# Patient Record
Sex: Female | Born: 1985 | Race: White | Hispanic: No | Marital: Single | Smoking: Current every day smoker
Health system: Southern US, Community
[De-identification: ages and names within clinical notes are randomized; demographics above are authoritative.]

## PROBLEM LIST (undated history)

## (undated) HISTORY — PX: OTHER SURGICAL HISTORY: SHX169

## (undated) HISTORY — PX: COSMETIC SURGERY: SHX468

---

## 2013-12-30 ENCOUNTER — Ambulatory Visit (INDEPENDENT_AMBULATORY_CARE_PROVIDER_SITE_OTHER): Payer: Managed Care, Other (non HMO) | Admitting: Obstetrics & Gynecology

## 2013-12-30 ENCOUNTER — Encounter: Payer: Self-pay | Admitting: Obstetrics & Gynecology

## 2013-12-30 VITALS — BP 147/86 | HR 107 | Resp 16 | Ht 59.0 in | Wt 123.0 lb

## 2013-12-30 DIAGNOSIS — Z30433 Encounter for removal and reinsertion of intrauterine contraceptive device: Secondary | ICD-10-CM

## 2013-12-30 DIAGNOSIS — Z30017 Encounter for initial prescription of implantable subdermal contraceptive: Secondary | ICD-10-CM

## 2013-12-30 DIAGNOSIS — Z308 Encounter for other contraceptive management: Secondary | ICD-10-CM

## 2013-12-30 MED ORDER — ETONOGESTREL 68 MG ~~LOC~~ IMPL: 1.0000 | DRUG_IMPLANT | Freq: Once | SUBCUTANEOUS | Status: DC

## 2013-12-30 MED ORDER — ETONOGESTREL 68 MG ~~LOC~~ IMPL
68.0000 mg | DRUG_IMPLANT | Freq: Once | SUBCUTANEOUS | Status: AC
  Administered 2013-12-30: 68 mg via SUBCUTANEOUS

## 2013-12-30 NOTE — Progress Notes (Signed)
   Subjective:    Patient ID: Cheryl CadetMaribeth Kennedy, female    DOB: [birth year], 28 y.o.   MRN: [id number]  HPI  28 yo G0 is here to have her soon to expire Nexplanon replaced with a new one. She has occasional spotting.  Review of Systems     Objective:   Physical Exam Consent was signed and time out was done. Her right arm was prepped with betadine after establishing the position of the Nexplanon. The area was infiltrated with 2 cc of 1% lidocaine. A small incision was made and the intact rod was easily removed. A steristrip was placed and her arm was noted to be hemostatic. It was bandaged.  She tolerated the procedure well.  After adequate anesthesia was assured, the Nexplanon device was placed according to standard of care. Her arm was hemostatic and was bandaged. She tolerated the procedure well.       Assessment & Plan:  Contraception- Nexplanon Preventative care- RTC 1 month for annual exam

## 2014-01-16 ENCOUNTER — Ambulatory Visit (INDEPENDENT_AMBULATORY_CARE_PROVIDER_SITE_OTHER): Payer: Managed Care, Other (non HMO) | Admitting: Obstetrics & Gynecology

## 2014-01-16 ENCOUNTER — Encounter: Payer: Self-pay | Admitting: Obstetrics & Gynecology

## 2014-01-16 VITALS — BP 122/75 | HR 78 | Resp 16 | Ht 59.0 in | Wt 126.0 lb

## 2014-01-16 DIAGNOSIS — Z113 Encounter for screening for infections with a predominantly sexual mode of transmission: Secondary | ICD-10-CM

## 2014-01-16 DIAGNOSIS — Z124 Encounter for screening for malignant neoplasm of cervix: Secondary | ICD-10-CM

## 2014-01-16 DIAGNOSIS — Z0000 Encounter for general adult medical examination without abnormal findings: Secondary | ICD-10-CM

## 2014-01-16 DIAGNOSIS — R1900 Intra-abdominal and pelvic swelling, mass and lump, unspecified site: Secondary | ICD-10-CM

## 2014-01-16 NOTE — Progress Notes (Signed)
Subjective:    Cheryl CadetMaribeth Kennedy is a 28 y.o. SW G0 female who presents for an annual exam. The patient has no complaints today. She is happy with her new Nexplanon. The patient is sexually active. GYN screening history: last pap: was normal. The patient wears seatbelts: yes. The patient participates in regular exercise: yes. Has the patient ever been transfused or tattooed?: yes. The patient reports that there is not domestic violence in her life.   Menstrual History: OB History    Gravida Para Term Preterm AB TAB SAB Ectopic Multiple Living   0 0 0 0 0 0 0 0 0 0       Menarche age: [age]  No LMP recorded. Patient has had an implant.    The following portions of the patient's history were reviewed and updated as appropriate: allergies, current medications, past family history, past medical history, past social history, past surgical history and problem list.  Review of Systems A comprehensive review of systems was negative. She reports some positional dyspareunia for "forever". Monogamous for about 18 months, lives by herself. 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 Declines a flu vaccine.   Objective:    BP 122/75 mmHg  Pulse 78  Resp 16  Ht 4\' 11"  (1.499 m)  Wt 126 lb (57.153 kg)  BMI 25.44 kg/m2  General Appearance:    Alert, cooperative, no distress, appears stated age  Head:    Normocephalic, without obvious abnormality, atraumatic  Eyes:    PERRL, conjunctiva/corneas clear, EOM's intact, fundi    benign, both eyes  Ears:    Normal TM's and external ear canals, both ears  Nose:   Nares normal, septum midline, mucosa normal, no drainage    or sinus tenderness  Throat:   Lips, mucosa, and tongue normal; teeth and gums normal  Neck:   Supple, symmetrical, trachea midline, no adenopathy;    thyroid:  no enlargement/tenderness/nodules; no carotid   bruit or JVD  Back:     Symmetric, no curvature, ROM normal, no CVA tenderness  Lungs:     Clear to  auscultation bilaterally, respirations unlabored  Chest Wall:    No tenderness or deformity   Heart:    Regular rate and rhythm, S1 and S2 normal, no murmur, rub   or gallop  Breast Exam:    No tenderness, masses, or nipple abnormality  Abdomen:     Soft, non-tender, bowel sounds active all four quadrants,    no masses, no organomegaly  Genitalia:    Normal female without lesion, discharge or tenderness, shaved, NSSA, NT, mobile, very hard 2 cm area in RLQ, tender     Extremities:   Extremities normal, atraumatic, no cyanosis or edema  Pulses:   2+ and symmetric all extremities  Skin:   Skin color, texture, turgor normal, no rashes or lesions  Lymph nodes:   Cervical, supraclavicular, and axillary nodes normal  Neurologic:   CNII-XII intact, normal strength, sensation and reflexes    throughout  .    Assessment:    Healthy female exam.   RLQ mass   Plan:     Breast self exam technique reviewed and patient encouraged to perform self-exam monthly. Chlamydia specimen. GC specimen. Thin prep Pap smear.   STI testing Gyn u/s

## 2014-01-17 LAB — HEPATITIS C ANTIBODY: HCV Ab: NEGATIVE

## 2014-01-17 LAB — HIV ANTIBODY (ROUTINE TESTING W REFLEX): HIV 1&2 Ab, 4th Generation: NONREACTIVE

## 2014-01-17 LAB — RPR

## 2014-01-17 LAB — TSH: TSH: 0.689 u[IU]/mL (ref 0.350–4.500)

## 2014-01-17 LAB — HEPATITIS B SURFACE ANTIGEN: Hepatitis B Surface Ag: NEGATIVE

## 2014-01-20 ENCOUNTER — Ambulatory Visit (INDEPENDENT_AMBULATORY_CARE_PROVIDER_SITE_OTHER): Payer: Managed Care, Other (non HMO)

## 2014-01-20 DIAGNOSIS — R1900 Intra-abdominal and pelvic swelling, mass and lump, unspecified site: Secondary | ICD-10-CM

## 2014-01-20 DIAGNOSIS — R1909 Other intra-abdominal and pelvic swelling, mass and lump: Secondary | ICD-10-CM

## 2014-01-20 LAB — CYTOLOGY - PAP

## 2014-01-28 ENCOUNTER — Encounter: Payer: Self-pay | Admitting: Obstetrics & Gynecology

## 2014-01-28 ENCOUNTER — Ambulatory Visit: Payer: Managed Care, Other (non HMO) | Admitting: Obstetrics & Gynecology

## 2014-01-28 VITALS — BP 121/81 | HR 87 | Resp 16 | Ht 59.0 in | Wt 126.0 lb

## 2014-01-28 DIAGNOSIS — R1900 Intra-abdominal and pelvic swelling, mass and lump, unspecified site: Secondary | ICD-10-CM

## 2014-01-28 NOTE — Progress Notes (Signed)
   Subjective:    Patient ID: Cheryl Kennedy, female    DOB: [birth year], 28 y.o.   MRN: [id number]  HPI She is here for her u/s results. I had felt a hard mass on my bimanual exam.   Review of Systems     Objective:   Physical Exam Normal u/s       Assessment & Plan:  Pelvic mass on u/s - must have been stool Reassurance given RTC 1 year/prn sooner

## 2015-06-09 IMAGING — US US TRANSVAGINAL NON-OB
1 series · 14 of 25 positions shown · non-contrast
Comparison: None

CLINICAL DATA: Palpable pelvic mass during annual exam. History of
implant in arm for birth control. Intermittent spotting. No known
LMP.

EXAM:
TRANSABDOMINAL AND TRANSVAGINAL ULTRASOUND OF PELVIS
TECHNIQUE: Both transabdominal and transvaginal ultrasound examinations of the
pelvis were performed. Transabdominal technique was performed for
global imaging of the pelvis including uterus, ovaries, adnexal
regions, and pelvic cul-de-sac. It was necessary to proceed with
endovaginal exam following the transabdominal exam to visualize the
uterus and endometrium, ovaries, adnexal region.

[Series 1: us transvaginal non-ob · 0.14mm/px · 14 of 100 slices shown]
[im 1/100]
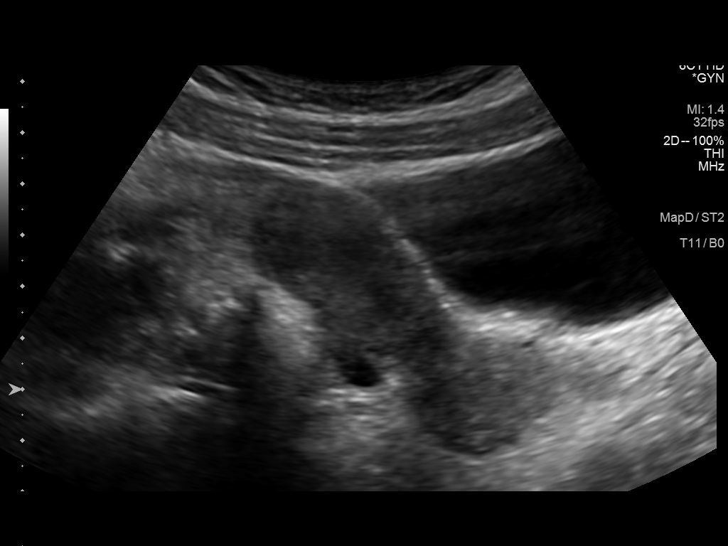
[im 9/100]
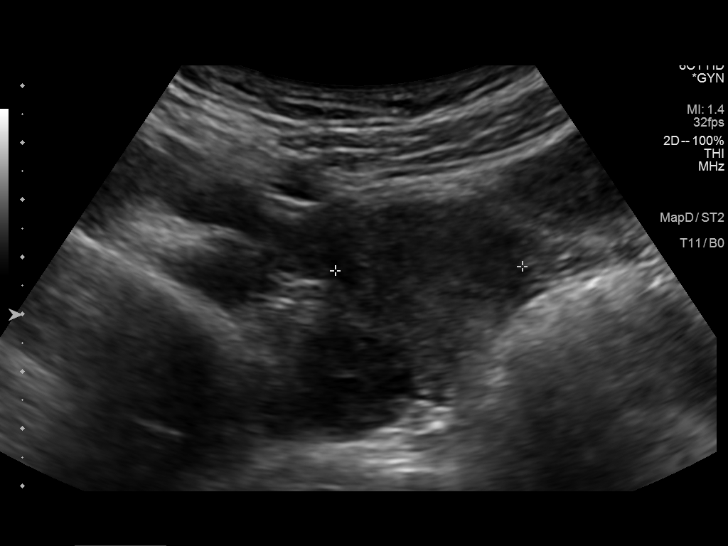
[im 17/100]
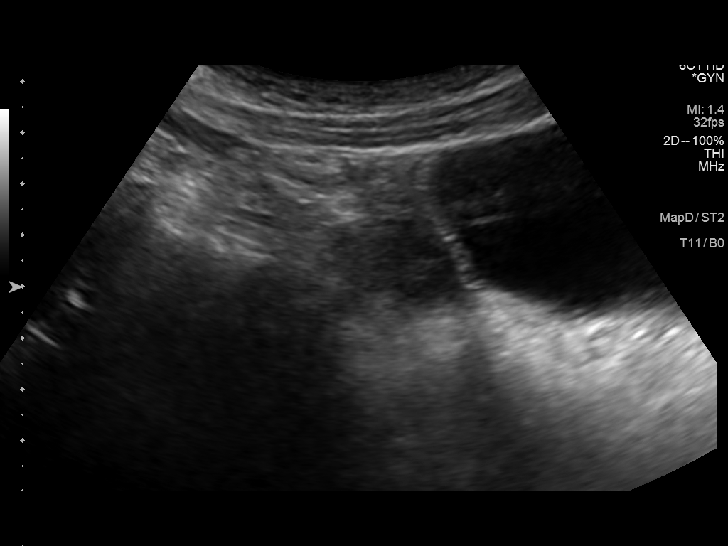
[im 25/100]
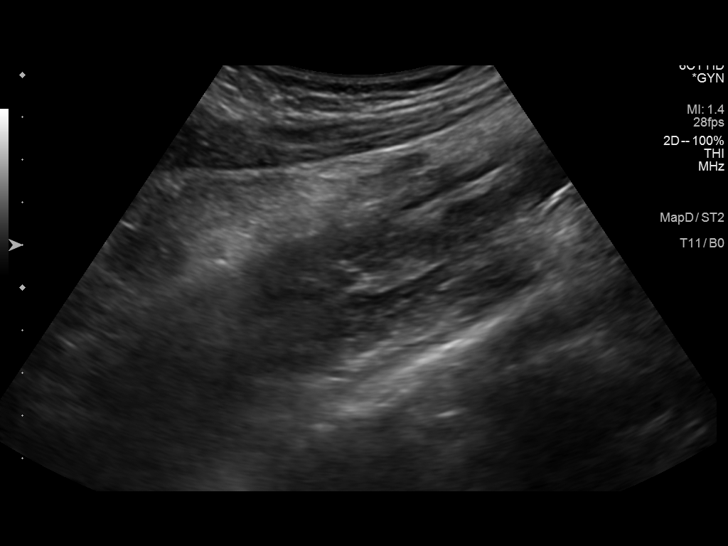
[im 34/100]
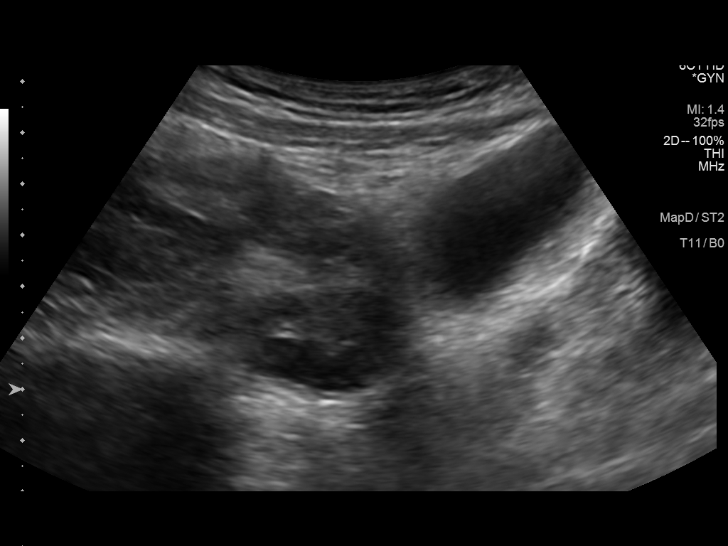
[im 38/100]
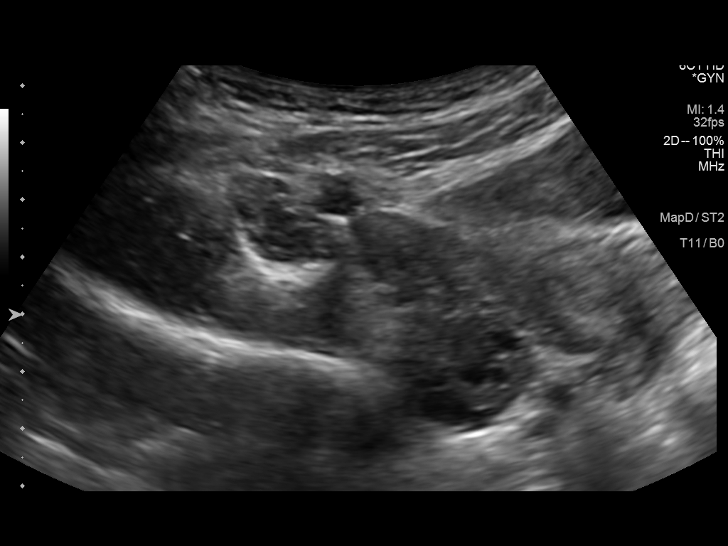
[im 46/100]
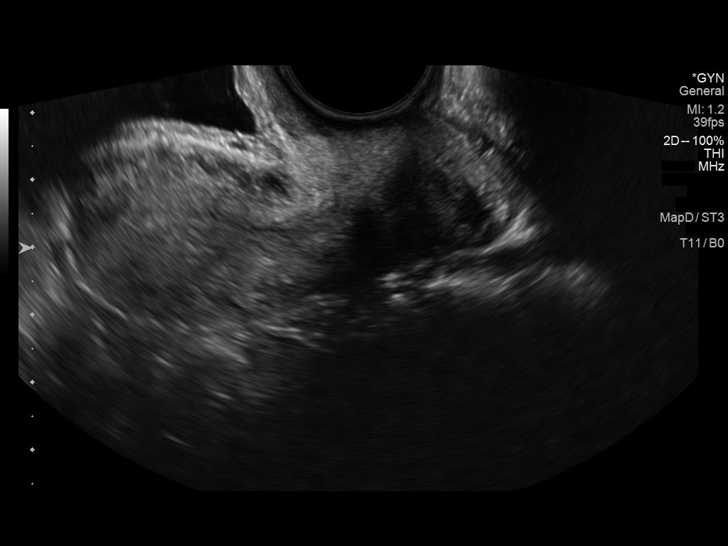
[im 54/100]
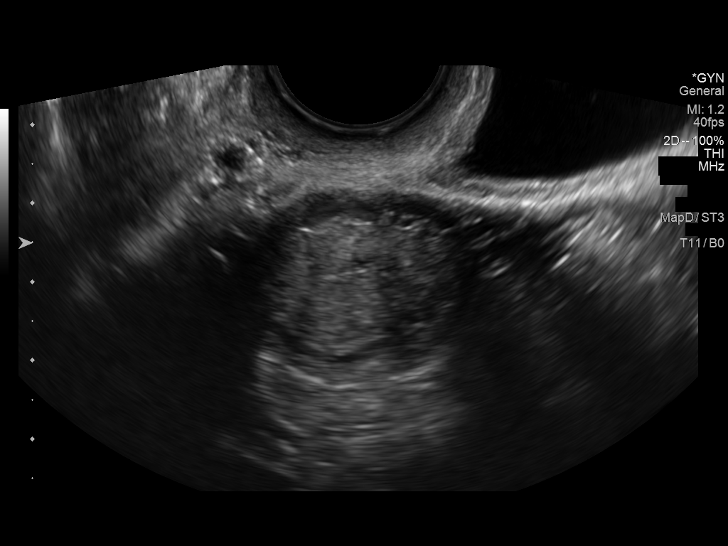
[im 62/100]
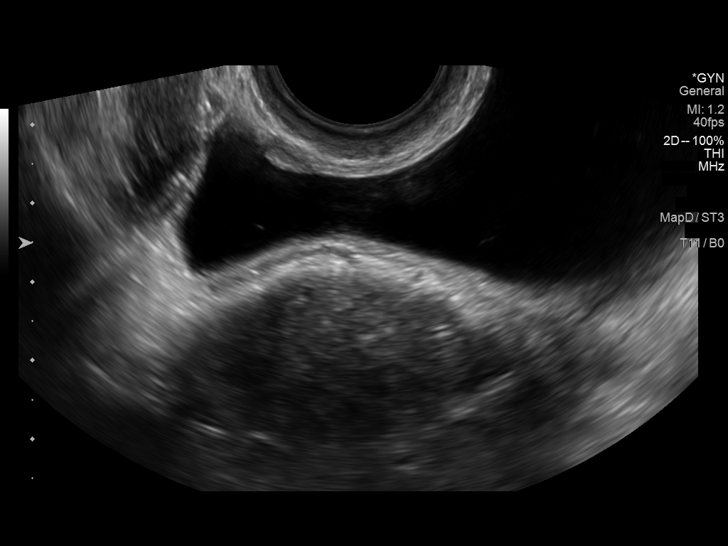
[im 67/100]
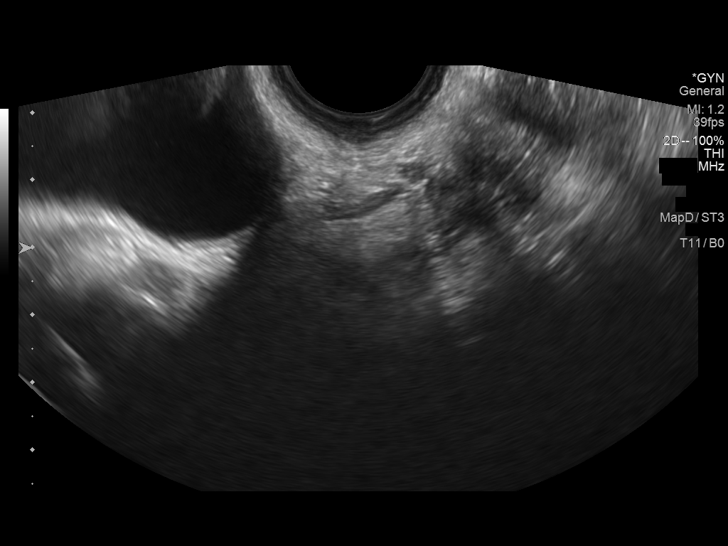
[im 75/100]
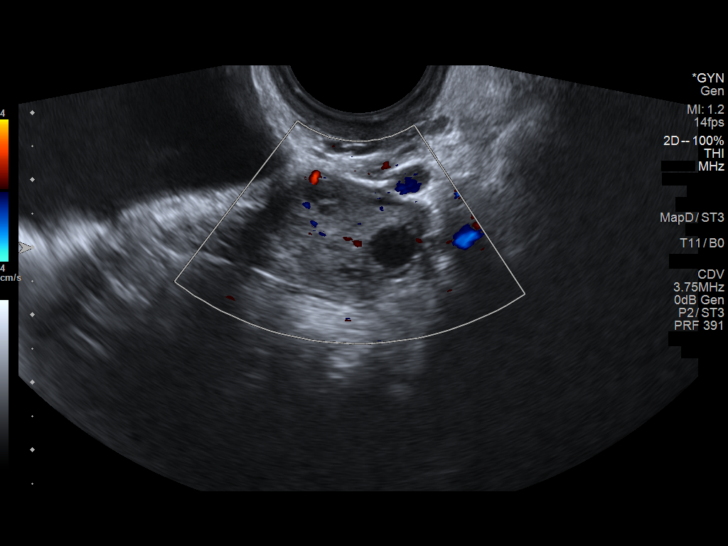
[im 83/100]
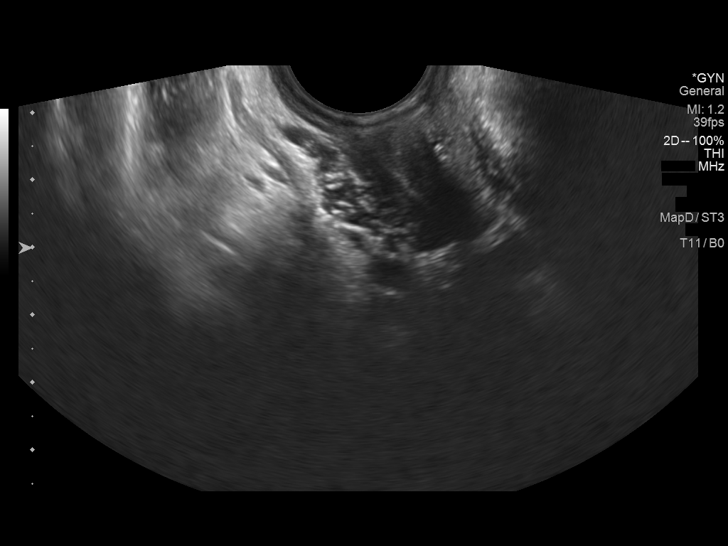
[im 91/100]
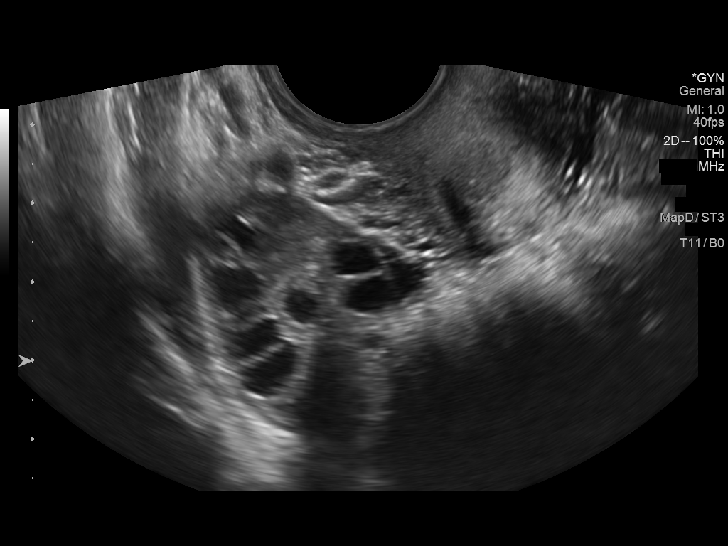
[im 100/100]
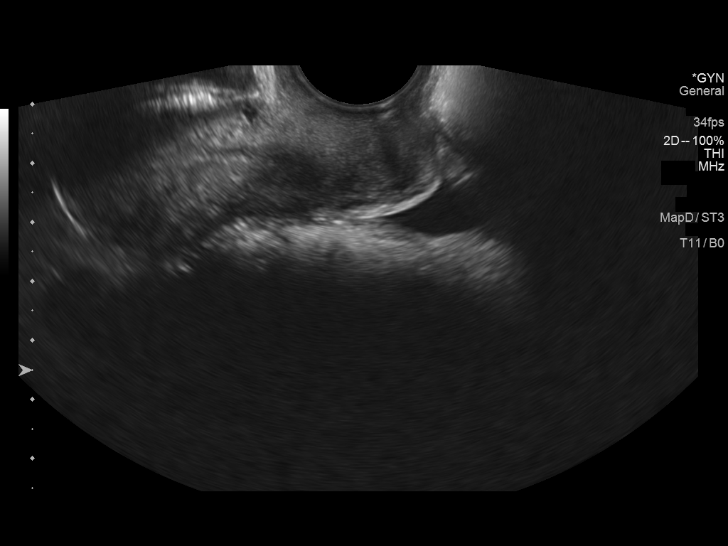

[14 of 25 positions shown; findings below may reference images not displayed]

FINDINGS: Uterus

Measurements: 5.7 x 2.8 x 3.4 cm. No fibroids or other mass
visualized.

Endometrium

Thickness: 2.0 mm.  No focal abnormality visualized.

Right ovary

Measurements: 3.4 x 2.4 x 1.9 cm. Normal appearance/no adnexal mass.

Left ovary

Measurements: 2.9 x 2.1 x 3.0 cm.. Normal appearance/no adnexal
mass.

Other findings

Small amount of free pelvic fluid is noted.
IMPRESSION: Normal pelvic ultrasound.  No adnexal mass identified.

## 2017-01-24 ENCOUNTER — Ambulatory Visit (INDEPENDENT_AMBULATORY_CARE_PROVIDER_SITE_OTHER): Payer: Managed Care, Other (non HMO) | Admitting: Obstetrics and Gynecology

## 2017-01-24 ENCOUNTER — Encounter: Payer: Self-pay | Admitting: Obstetrics and Gynecology

## 2017-01-24 VITALS — BP 124/73 | HR 85 | Resp 16 | Ht 59.0 in | Wt 130.0 lb

## 2017-01-24 DIAGNOSIS — Z3046 Encounter for surveillance of implantable subdermal contraceptive: Secondary | ICD-10-CM

## 2017-01-24 DIAGNOSIS — Z01419 Encounter for gynecological examination (general) (routine) without abnormal findings: Secondary | ICD-10-CM

## 2017-01-24 DIAGNOSIS — Z124 Encounter for screening for malignant neoplasm of cervix: Secondary | ICD-10-CM | POA: Diagnosis not present

## 2017-01-24 MED ORDER — NORGESTIMATE-ETH ESTRADIOL 0.25-35 MG-MCG PO TABS: 1.0000 | ORAL_TABLET | Freq: Every day | ORAL | 11 refills | Status: DC

## 2017-01-24 MED ORDER — ETONOGESTREL 68 MG ~~LOC~~ IMPL: 1.0000 | DRUG_IMPLANT | Freq: Once | SUBCUTANEOUS | 0 refills | Status: DC

## 2017-01-24 NOTE — Progress Notes (Signed)
Subjective:     Cheryl Kennedy is a 31 y.o. female G0 with BMI 26 who and is here for a comprehensive physical exam. The patient reports no problems. Patient is sexually active using Nexplanon for contraception. She reports amenorrhea with Nexplanon. She is due to have it removed and desires to have it replaced. Patient is without any other complaints. She denies pelvic pain or abnormal discharge  History reviewed. No pertinent past medical history. Past Surgical History:  Procedure Laterality Date  . COSMETIC SURGERY     breast enhancement   Family History  Problem Relation Age of Onset  . Hypertension Mother   . Diabetes Maternal Grandfather   . Stroke Maternal Grandfather   . Cancer Maternal Grandfather   . Cancer Paternal Grandfather      Social History   Socioeconomic History  . Marital status: Single    Spouse name: Not on file  . Number of children: Not on file  . Years of education: Not on file  . Highest education level: Not on file  Social Needs  . Financial resource strain: Not on file  . Food insecurity - worry: Not on file  . Food insecurity - inability: Not on file  . Transportation needs - medical: Not on file  . Transportation needs - non-medical: Not on file  Occupational History  . Not on file  Tobacco Use  . Smoking status: Current Every Day Smoker    Packs/day: 0.25    Years: 7.00    Pack years: 1.75    Types: Cigarettes  . Smokeless tobacco: Never Used  Substance and Sexual Activity  . Alcohol use: No    Alcohol/week: 0.0 oz  . Drug use: No  . Sexual activity: Yes    Partners: Male    Birth control/protection: Implant  Other Topics Concern  . Not on file  Social History Narrative  . Not on file   Health Maintenance  Topic Date Due  . TETANUS/TDAP  02/14/2007  . INFLUENZA VACCINE  09/14/2016  . PAP SMEAR  01/16/2017  . HIV Screening  Completed       Review of Systems Pertinent items are noted in HPI.   Objective:  Blood pressure  124/73, pulse 85, resp. rate 16, height 4\' 11"  (1.499 m), weight 130 lb (59 kg).     GENERAL: Well-developed, well-nourished female in no acute distress.  HEENT: Normocephalic, atraumatic. Sclerae anicteric.  NECK: Supple. Normal thyroid.  LUNGS: Clear to auscultation bilaterally.  HEART: Regular rate and rhythm. BREASTS: Symmetric in size. No palpable masses or lymphadenopathy, skin changes, or nipple drainage. ABDOMEN: Soft, nontender, nondistended. No organomegaly. PELVIC: Normal external female genitalia. Vagina is pink and rugated.  Normal discharge. Normal appearing cervix. Uterus is normal in size. No adnexal mass or tenderness. EXTREMITIES: No cyanosis, clubbing, or edema, 2+ distal pulses.    Assessment:    Healthy female exam.      Plan:    pap smear collected  Nexplanon Removal Patient given informed consent for removal of her Implanon, time out was performed.  Signed copy in the chart.  Appropriate time out taken. Implanon site identified.  Area prepped in usual sterile fashon. One cc of 1% lidocaine was used to anesthetize the area at the distal end of the implant. A small stab incision was made right beside the implant on the distal portion.  A pressure bandage was applied to reduce any bruising.  The patient tolerated the procedure well and was given post procedure  instructions.  Nexplanon removal attempted. It was difficult to palpate. Nexplanon was seen on ultrasound but was deep in the tissue. It was grasped twice but was difficult to bring to the skin incision. Nexplanon procedure was aborted. Patient will be schedule for removal in the OR.  Discussed contraception options until Nexplanon removal and replacement. Patient opted for OCP. Rx provided Patient will be contacted with abnormal results  See After Visit Summary for Counseling Recommendations

## 2017-01-25 ENCOUNTER — Encounter (HOSPITAL_COMMUNITY): Payer: Self-pay

## 2017-01-26 LAB — CYTOLOGY - PAP: Diagnosis: NEGATIVE

## 2017-02-01 ENCOUNTER — Telehealth (HOSPITAL_COMMUNITY): Payer: Self-pay

## 2017-02-01 NOTE — Telephone Encounter (Signed)
Sent the patient a letter with her surgery date and time on 01/25/2017. Letter returned on 01/1917, with a returned mail sticker that states "temporarily away".  Called patient to give her surgery date and time, no answer, unable to leave voice message, mailbox full.  Patient does not have an active mychart account.

## 2017-02-23 ENCOUNTER — Encounter (HOSPITAL_BASED_OUTPATIENT_CLINIC_OR_DEPARTMENT_OTHER): Payer: Self-pay | Admitting: *Deleted

## 2017-02-23 ENCOUNTER — Other Ambulatory Visit: Payer: Self-pay

## 2017-02-24 ENCOUNTER — Encounter (HOSPITAL_BASED_OUTPATIENT_CLINIC_OR_DEPARTMENT_OTHER)
Admission: RE | Admit: 2017-02-24 | Discharge: 2017-02-24 | Disposition: A | Discharge status: Home / Self Care | Payer: Managed Care, Other (non HMO) | Source: Ambulatory Visit | Attending: Obstetrics and Gynecology | Admitting: Obstetrics and Gynecology

## 2017-02-24 DIAGNOSIS — Z01812 Encounter for preprocedural laboratory examination: Secondary | ICD-10-CM | POA: Insufficient documentation

## 2017-02-24 LAB — CBC
HEMATOCRIT: 42.2 % (ref 36.0–46.0)
Hemoglobin: 13.9 g/dL (ref 12.0–15.0)
MCH: 30.7 pg (ref 26.0–34.0)
MCHC: 32.9 g/dL (ref 30.0–36.0)
MCV: 93.2 fL (ref 78.0–100.0)
PLATELETS: 273 10*3/uL (ref 150–400)
RBC: 4.53 MIL/uL (ref 3.87–5.11)
RDW: 12.8 % (ref 11.5–15.5)
WBC: 8 10*3/uL (ref 4.0–10.5)

## 2017-02-24 LAB — POCT PREGNANCY, URINE: Preg Test, Ur: NEGATIVE

## 2017-02-28 ENCOUNTER — Telehealth: Payer: Self-pay

## 2017-02-28 NOTE — H&P (Signed)
Cheryl CadetMaribeth Kennedy is an 32 y.o. female here for Nexplanon removal due to expired device. Patient has been using OCP for contraception. Patient is without complaints. Nexplanon removal was attempted in the office but was found difficult to remove. Nexplanon is placed in her left arm  Pertinent Gynecological History: Menses: regular every month without intermenstrual spotting Contraception: OCP (estrogen/progesterone) DES exposure: denies Blood transfusions: none Sexually transmitted diseases: no past history Previous GYN Procedures: n/a  Last mammogram: n/a Last pap: normal Date: 01/2017   Menstrual History: [name] was 02/17/2017.    History reviewed. No pertinent past medical history.  Past Surgical History:  Procedure Laterality Date  . breast augmentation    . COSMETIC SURGERY     breast enhancement    Family History  Problem Relation Age of Onset  . Hypertension Mother   . Diabetes Maternal Grandfather   . Stroke Maternal Grandfather   . Cancer Maternal Grandfather   . Cancer Paternal Grandfather     Social History:  reports that she has been smoking cigarettes.  She has a 1.75 pack-year smoking history. she has never used smokeless tobacco. She reports that she does not drink alcohol or use drugs.  Allergies: No Known Allergies  Medications Prior to Admission  Medication Sig Dispense Refill Last Dose  . norgestimate-ethinyl estradiol (ORTHO-CYCLEN,SPRINTEC,PREVIFEM) 0.25-35 MG-MCG tablet Take 1 tablet by mouth daily. 1 Package 11 03/01/2017 at 0800  . etonogestrel (NEXPLANON) 68 MG IMPL implant 1 each by Subdermal route once.   Taking  . etonogestrel (NEXPLANON) 68 MG IMPL implant 1 each (68 mg total) by Subdermal route once for 1 dose. 1 each 0     ROS See pertinent in HPI Blood pressure 130/77, pulse 98, temperature 98 F (36.7 C), temperature source Oral, resp. rate 20, height 4\' 11"  (1.499 m), weight 125 lb (56.7 kg), last menstrual period  02/17/2017, SpO2 98 %. Physical Exam GENERAL: Well-developed, well-nourished female in no acute distress.  HEENT: Normocephalic, atraumatic. Sclerae anicteric.  NECK: Supple. Normal thyroid.  LUNGS: Clear to auscultation bilaterally.  HEART: Regular rate and rhythm. ABDOMEN: Soft, nontender, nondistended. No organomegaly. PELVIC: Not indicated EXTREMITIES: No cyanosis, clubbing, or edema, 2+ distal pulses.  No results found for this or any previous visit (from the past 24 hour(s)).  No results found.  Assessment/Plan: 32 yo here for Nexplanon removal due to expired device.  - Risks, benefits and alternatives were explained including but not limited to risks of bleeding, infection and damage to adjacent structures (muscle and neuro/vascular system). Patient verbalized understanding and all questions were answered - Patient plans to continue with OCP for contraception at this time  Cheryl Kennedy 03/01/2017, 1:24 PM

## 2017-02-28 NOTE — Telephone Encounter (Signed)
Patient returned call back stating that for her surgery tomorrow she just wants the nexplanon removed and not replaced. She does not want nexplanon inserted again. I made patient aware that her device is being shipped to [place] office tomorrow and I will call patient when it arrives so that she can call her insurance company back to see how to return device since she does not want to use it any more.

## 2017-02-28 NOTE — Telephone Encounter (Signed)
Called patient left message to call office. [facility] about her delivery of Nexplanon device for surgery insert tomorrow.

## 2017-03-01 ENCOUNTER — Ambulatory Visit (HOSPITAL_BASED_OUTPATIENT_CLINIC_OR_DEPARTMENT_OTHER): Payer: 59 | Admitting: Certified Registered"

## 2017-03-01 ENCOUNTER — Encounter (HOSPITAL_BASED_OUTPATIENT_CLINIC_OR_DEPARTMENT_OTHER)
Admission: RE | Disposition: A | Discharge status: Home / Self Care | Payer: Self-pay | Source: Ambulatory Visit | Attending: Obstetrics and Gynecology

## 2017-03-01 ENCOUNTER — Ambulatory Visit (HOSPITAL_BASED_OUTPATIENT_CLINIC_OR_DEPARTMENT_OTHER)
Admission: RE | Admit: 2017-03-01 | Discharge: 2017-03-01 | Disposition: A | Discharge status: Home / Self Care | Payer: 59 | Source: Ambulatory Visit | Attending: Obstetrics and Gynecology | Admitting: Obstetrics and Gynecology

## 2017-03-01 ENCOUNTER — Encounter (HOSPITAL_BASED_OUTPATIENT_CLINIC_OR_DEPARTMENT_OTHER): Payer: Self-pay | Admitting: *Deleted

## 2017-03-01 ENCOUNTER — Telehealth: Payer: Self-pay

## 2017-03-01 ENCOUNTER — Other Ambulatory Visit: Payer: Self-pay

## 2017-03-01 DIAGNOSIS — Z3046 Encounter for surveillance of implantable subdermal contraceptive: Secondary | ICD-10-CM | POA: Diagnosis not present

## 2017-03-01 DIAGNOSIS — F17210 Nicotine dependence, cigarettes, uncomplicated: Secondary | ICD-10-CM | POA: Diagnosis not present

## 2017-03-01 HISTORY — PX: REMOVAL OF DRUG DELIVERY IMPLANT: SHX6585

## 2017-03-01 SURGERY — REMOVAL, DRUG DELIVERY IMPLANT
Anesthesia: General | Site: Arm Upper | Laterality: Left

## 2017-03-01 MED ORDER — MIDAZOLAM HCL 2 MG/2ML IJ SOLN
INTRAMUSCULAR | Status: AC
  Filled 2017-03-01: qty 2

## 2017-03-01 MED ORDER — DEXAMETHASONE SODIUM PHOSPHATE 10 MG/ML IJ SOLN
INTRAMUSCULAR | Status: DC | PRN
  Administered 2017-03-01: 10 mg via INTRAVENOUS

## 2017-03-01 MED ORDER — HYDROMORPHONE HCL 1 MG/ML IJ SOLN: 0.2500 mg | INTRAMUSCULAR | Status: DC | PRN

## 2017-03-01 MED ORDER — LACTATED RINGERS IV SOLN
INTRAVENOUS | Status: DC
  Administered 2017-03-01: 13:00:00 via INTRAVENOUS

## 2017-03-01 MED ORDER — FENTANYL CITRATE (PF) 100 MCG/2ML IJ SOLN
INTRAMUSCULAR | Status: AC
Start: 2017-03-01 — End: ?
  Filled 2017-03-01: qty 2

## 2017-03-01 MED ORDER — MIDAZOLAM HCL 2 MG/2ML IJ SOLN
1.0000 mg | INTRAMUSCULAR | Status: DC | PRN
  Administered 2017-03-01: 2 mg via INTRAVENOUS

## 2017-03-01 MED ORDER — PROPOFOL 10 MG/ML IV BOLUS
INTRAVENOUS | Status: AC
  Filled 2017-03-01: qty 20

## 2017-03-01 MED ORDER — IBUPROFEN 600 MG PO TABS: 600.0000 mg | ORAL_TABLET | Freq: Four times a day (QID) | ORAL | 3 refills | Status: DC | PRN

## 2017-03-01 MED ORDER — MEPERIDINE HCL 25 MG/ML IJ SOLN: 6.2500 mg | INTRAMUSCULAR | Status: DC | PRN

## 2017-03-01 MED ORDER — ONDANSETRON HCL 4 MG/2ML IJ SOLN: 4.0000 mg | Freq: Once | INTRAMUSCULAR | Status: DC | PRN

## 2017-03-01 MED ORDER — FENTANYL CITRATE (PF) 100 MCG/2ML IJ SOLN
50.0000 ug | INTRAMUSCULAR | Status: DC | PRN
  Administered 2017-03-01: 100 ug via INTRAVENOUS
  Administered 2017-03-01: 50 ug via INTRAVENOUS

## 2017-03-01 MED ORDER — LIDOCAINE HCL (CARDIAC) 20 MG/ML IV SOLN
INTRAVENOUS | Status: DC | PRN
  Administered 2017-03-01: 100 mg via INTRAVENOUS

## 2017-03-01 MED ORDER — LIDOCAINE HCL (PF) 1 % IJ SOLN
INTRAMUSCULAR | Status: AC
  Filled 2017-03-01: qty 30

## 2017-03-01 MED ORDER — SCOPOLAMINE 1 MG/3DAYS TD PT72: 1.0000 | MEDICATED_PATCH | Freq: Once | TRANSDERMAL | Status: DC | PRN

## 2017-03-01 MED ORDER — LIDOCAINE HCL (PF) 1 % IJ SOLN
INTRAMUSCULAR | Status: DC | PRN
  Administered 2017-03-01: 10 mL

## 2017-03-01 MED ORDER — CHLOROPROCAINE HCL 1 % IJ SOLN
INTRAMUSCULAR | Status: AC
  Filled 2017-03-01: qty 30

## 2017-03-01 MED ORDER — LIDOCAINE 2% (20 MG/ML) 5 ML SYRINGE
INTRAMUSCULAR | Status: AC
  Filled 2017-03-01: qty 5

## 2017-03-01 MED ORDER — FENTANYL CITRATE (PF) 100 MCG/2ML IJ SOLN
INTRAMUSCULAR | Status: AC
  Filled 2017-03-01: qty 2

## 2017-03-01 MED ORDER — ONDANSETRON HCL 4 MG/2ML IJ SOLN
INTRAMUSCULAR | Status: AC
  Filled 2017-03-01: qty 2

## 2017-03-01 MED ORDER — PROPOFOL 10 MG/ML IV BOLUS
INTRAVENOUS | Status: DC | PRN
  Administered 2017-03-01: 150 mg via INTRAVENOUS

## 2017-03-01 SURGICAL SUPPLY — 15 items
COVER BACK TABLE 60X90IN (DRAPES) ×3 IMPLANT
DRSG COVADERM PLUS 2X2 (GAUZE/BANDAGES/DRESSINGS) ×3 IMPLANT
GLOVE BIOGEL PI IND STRL 6.5 (GLOVE) ×1 IMPLANT
GLOVE BIOGEL PI IND STRL 7.0 (GLOVE) ×1 IMPLANT
GLOVE BIOGEL PI IND STRL 7.5 (GLOVE) ×1 IMPLANT
GLOVE BIOGEL PI INDICATOR 6.5 (GLOVE) ×2
GLOVE BIOGEL PI INDICATOR 7.0 (GLOVE) ×2
GLOVE BIOGEL PI INDICATOR 7.5 (GLOVE) ×2
GLOVE SURG SS PI 6.0 STRL IVOR (GLOVE) ×3 IMPLANT
GOWN STRL REUS W/TWL LRG LVL3 (GOWN DISPOSABLE) ×6 IMPLANT
NS IRRIG 1000ML POUR BTL (IV SOLUTION) ×3 IMPLANT
PACK BASIN DAY SURGERY FS (CUSTOM PROCEDURE TRAY) ×3 IMPLANT
PAD PREP 24X48 CUFFED NSTRL (MISCELLANEOUS) ×3 IMPLANT
SUT VICRYL 4-0 PS2 18IN ABS (SUTURE) ×3 IMPLANT
TOWEL OR 17X24 6PK STRL BLUE (TOWEL DISPOSABLE) ×6 IMPLANT

## 2017-03-01 NOTE — Anesthesia Postprocedure Evaluation (Signed)
Anesthesia Post Note  Patient: Cheryl Kennedy  Procedure(s) Performed: REMOVAL OF DRUG DELIVERY IMPLANT (Left Arm Upper)     Patient location during evaluation: PACU Anesthesia Type: General Level of consciousness: awake and alert Pain management: pain level controlled Vital Signs Assessment: post-procedure vital signs reviewed and stable Respiratory status: spontaneous breathing, nonlabored ventilation, respiratory function stable and patient connected to nasal cannula oxygen Cardiovascular status: blood pressure returned to baseline and stable Postop Assessment: no apparent nausea or vomiting Anesthetic complications: no    Last Vitals:  Vitals:   03/01/17 1417 03/01/17 1426  BP:  126/86  Pulse: 71 64  Resp: (!) 21 16  Temp:  36.6 C  SpO2: 100% 100%    Last Pain:  Vitals:   03/01/17 1426  TempSrc: Oral  PainSc: 0-No pain                 Marjie Chea DAVID

## 2017-03-01 NOTE — Telephone Encounter (Signed)
Left message for patient that we received her Nexplanon and we can hold it in office or she can come pick it up to try to return to pharmacy.

## 2017-03-01 NOTE — Anesthesia Procedure Notes (Signed)
Procedure Name: LMA Insertion Performed by: Karen KitchensKelly, Braylan Faul M, CRNA Pre-anesthesia Checklist: Patient identified, Suction available, Emergency Drugs available, Patient being monitored and Timeout performed Preoxygenation: Pre-oxygenation with 100% oxygen Induction Type: IV induction Ventilation: Mask ventilation without difficulty LMA: LMA inserted LMA Size: 3.0 Number of attempts: 1 Placement Confirmation: CO2 detector,  positive ETCO2 and breath sounds checked- equal and bilateral Dental Injury: Teeth and Oropharynx as per pre-operative assessment

## 2017-03-01 NOTE — Anesthesia Preprocedure Evaluation (Signed)
Anesthesia Evaluation  Patient identified by MRN, date of birth, ID band Patient awake    Reviewed: Allergy & Precautions, NPO status , Patient's Chart, lab work & pertinent test results  Airway Mallampati: I  TM Distance: >3 FB Neck ROM: Full    Dental   Pulmonary Current Smoker,    Pulmonary exam normal        Cardiovascular Normal cardiovascular exam     Neuro/Psych    GI/Hepatic   Endo/Other    Renal/GU      Musculoskeletal   Abdominal   Peds  Hematology   Anesthesia Other Findings   Reproductive/Obstetrics                             Anesthesia Physical Anesthesia Plan  ASA: II  Anesthesia Plan: General   Post-op Pain Management:    Induction: Intravenous  PONV Risk Score and Plan: 2  Airway Management Planned: LMA  Additional Equipment:   Intra-op Plan:   Post-operative Plan: Extubation in OR  Informed Consent: I have reviewed the patients History and Physical, chart, labs and discussed the procedure including the risks, benefits and alternatives for the proposed anesthesia with the patient or authorized representative who has indicated his/her understanding and acceptance.     Plan Discussed with: CRNA and Surgeon  Anesthesia Plan Comments:         Anesthesia Quick Evaluation

## 2017-03-01 NOTE — Transfer of Care (Signed)
Immediate Anesthesia Transfer of Care Note  Patient: Cheryl Kennedy  Procedure(s) Performed: REMOVAL OF DRUG DELIVERY IMPLANT (Left Arm Upper)  Patient Location: PACU  Anesthesia Type:General  Level of Consciousness: awake, alert  and oriented  Airway & Oxygen Therapy: Patient Spontanous Breathing and Patient connected to face mask oxygen  Post-op Assessment: Report given to RN and Post -op Vital signs reviewed and stable  Post vital signs: Reviewed and stable  Last Vitals:  Vitals:   03/01/17 1302  BP: 130/77  Pulse: 98  Resp: 20  Temp: 36.7 C  SpO2: 98%    Last Pain:  Vitals:   03/01/17 1302  TempSrc: Oral         Complications: No apparent anesthesia complications

## 2017-03-01 NOTE — Discharge Instructions (Signed)

## 2017-03-01 NOTE — Op Note (Signed)
PROCEDURE DATE:   PREOPERATIVE DIAGNOSES: Expired Nexplanon, deep in arm, failed attempts of in office removal POSTOPERATIVE DIAGNOSES: The same PROCEDURE: IUD Removal under anesthesia SURGEON: Dr. Mora Bellman  INDICATIONS: 32 y.o. here for Nexplanon removal under anesthesia secondary to failed in office Nexplanon removal attempts. The risks of surgery were discussed in detail with the patient including but not limited to: bleeding; infection which may require antibiotics; injury to surrounding structures which may involve muscle or neurovascular bundle ; surgical site problems and other postoperative/anesthesia complications. Written informed consent was obtained.   FINDINGS: Normal left arm with palpable Nexplanon deep in adipose tissue   ANESTHESIA: MAC, local using 10 ml of 0.5% Marcaine INTRAVENOUS FLUIDS: 700 ml ESTIMATED BLOOD LOSS: 5 ml COMPLICATIONS: None immediate  PROCEDURE IN DETAIL: The patient was then taken to the operating room where anesthesia was administered and was found to be adequate. She was placed in the supine position, and her left arm was prepped and draped in a sterile manner. After an adequate timeout was performed, attention was turned to her left arm above her elbow. The nexplanon site identified.  A small stab incision was made right beside the implant on the distal portion.  The Nexplanon rod was grasped using hemostats and removed without difficulty.  There was less than 3 cc blood loss. There were no complications.  The skin incision was closed with 4.0 Vicryl.  A pressure bandage was applied to reduce any bruising.  The patient tolerated the procedure well and was taken to the recovery area awake and in stable condition.  The patient will be discharged to home as per PACU criteria. Routine postoperative instructions given. She will use OCP for contraception.

## 2017-03-02 ENCOUNTER — Encounter (HOSPITAL_BASED_OUTPATIENT_CLINIC_OR_DEPARTMENT_OTHER): Payer: Self-pay | Admitting: Obstetrics and Gynecology

## 2017-12-19 ENCOUNTER — Other Ambulatory Visit: Payer: Self-pay | Admitting: *Deleted

## 2017-12-19 MED ORDER — NORGESTIMATE-ETH ESTRADIOL 0.25-35 MG-MCG PO TABS: 1.0000 | ORAL_TABLET | Freq: Every day | ORAL | 3 refills | Status: DC

## 2018-03-08 ENCOUNTER — Other Ambulatory Visit: Payer: Self-pay | Admitting: Obstetrics and Gynecology

## 2018-11-22 ENCOUNTER — Telehealth: Payer: Self-pay

## 2018-11-22 MED ORDER — NORGESTIMATE-ETH ESTRADIOL 0.25-35 MG-MCG PO TABS: 1.0000 | ORAL_TABLET | Freq: Every day | ORAL | 0 refills | Status: DC

## 2018-11-22 NOTE — Telephone Encounter (Signed)
Pt called requesting refill of OCP. Pt is aware she will only receive one month refill until she has annual appt on 12/26/2018.

## 2018-12-26 ENCOUNTER — Ambulatory Visit: Payer: 59 | Admitting: Obstetrics and Gynecology

## 2019-01-09 ENCOUNTER — Ambulatory Visit: Payer: 59 | Admitting: Obstetrics and Gynecology

## 2019-01-16 ENCOUNTER — Ambulatory Visit (INDEPENDENT_AMBULATORY_CARE_PROVIDER_SITE_OTHER): Payer: 59 | Admitting: Obstetrics and Gynecology

## 2019-01-16 ENCOUNTER — Other Ambulatory Visit: Payer: Self-pay

## 2019-01-16 ENCOUNTER — Encounter: Payer: Self-pay | Admitting: Obstetrics and Gynecology

## 2019-01-16 VITALS — BP 121/80 | HR 85 | Ht 59.0 in | Wt 146.0 lb

## 2019-01-16 DIAGNOSIS — Z01419 Encounter for gynecological examination (general) (routine) without abnormal findings: Secondary | ICD-10-CM

## 2019-01-16 DIAGNOSIS — Z3009 Encounter for other general counseling and advice on contraception: Secondary | ICD-10-CM

## 2019-01-16 DIAGNOSIS — Z124 Encounter for screening for malignant neoplasm of cervix: Secondary | ICD-10-CM | POA: Diagnosis not present

## 2019-01-16 DIAGNOSIS — Z113 Encounter for screening for infections with a predominantly sexual mode of transmission: Secondary | ICD-10-CM

## 2019-01-16 DIAGNOSIS — Z1151 Encounter for screening for human papillomavirus (HPV): Secondary | ICD-10-CM

## 2019-01-16 MED ORDER — NORGESTIMATE-ETH ESTRADIOL 0.25-35 MG-MCG PO TABS: 1.0000 | ORAL_TABLET | Freq: Every day | ORAL | 11 refills | Status: DC

## 2019-01-16 NOTE — Progress Notes (Signed)
GYNECOLOGY ANNUAL PREVENTATIVE CARE ENCOUNTER NOTE  Subjective:   Cheryl Kennedy is a 33 y.o. G0P0000 female here for a annual gynecologic exam. Current complaints: wondering if 2 day periods are normal with her OCPs. She is happy with OCPs, desires to continue them, only occasionally misses pills.  Denies abnormal vaginal bleeding, discharge, pelvic pain, problems with intercourse or other gynecologic concerns.   Desires STI screen. No known exposures.  Gynecologic History [name] was 12/29/2018. Contraception: OCP (estrogen/progesterone) Last Pap: 01/2017. Results were: normal Last mammogram: n/a. Gardisil: has  received  Obstetric History OB History  Gravida Para Term Preterm AB Living  0 0 0 0 0 0  SAB TAB Ectopic Multiple Live Births  0 0 0 0      History reviewed. No pertinent past medical history.  Past Surgical History:  Procedure Laterality Date  . breast augmentation    . COSMETIC SURGERY     breast enhancement  . REMOVAL OF DRUG DELIVERY IMPLANT Left 03/01/2017   Procedure: REMOVAL OF DRUG DELIVERY IMPLANT;  Surgeon: Mora Bellman, MD;  Location: [facility];  Service: Gynecology;  Laterality: Left;    Current Outpatient Medications on File Prior to Visit  Medication Sig Dispense Refill  . norgestimate-ethinyl estradiol (SPRINTEC 28) 0.25-35 MG-MCG tablet Take 1 tablet by mouth daily. 30 tablet 0   No current facility-administered medications on file prior to visit.     No Known Allergies  Social History   Socioeconomic History  . Marital status: Single    Spouse name: Not on file  . Number of children: Not on file  . Years of education: Not on file  . Highest education level: Not on file  Occupational History  . Not on file  Social Needs  . Financial resource strain: Not on file  . Food insecurity    Worry: Not on file    Inability: Not on file  . Transportation needs    Medical: Not on file   Non-medical: Not on file  Tobacco Use  . Smoking status: Current Every Day Smoker    Packs/day: 0.25    Years: 7.00    Pack years: 1.75    Types: Cigarettes  . Smokeless tobacco: Never Used  Substance and Sexual Activity  . Alcohol use: No    Alcohol/week: 0.0 standard drinks    Comment: wine socially  . Drug use: No  . Sexual activity: Yes    Partners: Male    Birth control/protection: Pill  Lifestyle  . Physical activity    Days per week: Not on file    Minutes per session: Not on file  . Stress: Not on file  Relationships  . Social Herbalist on phone: Not on file    Gets together: Not on file    Attends religious service: Not on file    Active member of club or organization: Not on file    Attends meetings of clubs or organizations: Not on file    Relationship status: Not on file  . Intimate partner violence    Fear of current or ex partner: Not on file    Emotionally abused: Not on file    Physically abused: Not on file    Forced sexual activity: Not on file  Other Topics Concern  . Not on file  Social History Narrative  . Not on file   Family History  Problem Relation Age of Onset  . Hypertension Mother   .  Diabetes Maternal Grandfather   . Stroke Maternal Grandfather   . Cancer Maternal Grandfather   . Cancer Paternal Grandfather     The following portions of the patient's history were reviewed and updated as appropriate: allergies, current medications, past family history, past medical history, past social history, past surgical history and problem list.  Review of Systems Pertinent items are noted in HPI.   Objective:  BP 121/80   Pulse 85   Ht 4\' 11"  (1.499 m)   Wt 146 lb (66.2 kg)   LMP 12/29/2018   BMI 29.49 kg/m  CONSTITUTIONAL: Well-developed, well-nourished female in no acute distress.  HENT:  Normocephalic, atraumatic, External right and left ear normal. Oropharynx is clear and moist EYES: Conjunctivae and EOM are normal. Pupils  are equal, round, and reactive to light. No scleral icterus.  NECK: Normal range of motion, supple, no masses.  Normal thyroid.  SKIN: Skin is warm and dry. No rash noted. Not diaphoretic. No erythema. No pallor. NEUROLOGIC: Alert and oriented to person, place, and time. Normal reflexes, muscle tone coordination. No cranial nerve deficit noted. PSYCHIATRIC: Normal mood and affect. Normal behavior. Normal judgment and thought content. CARDIOVASCULAR: Normal heart rate noted, regular rhythm RESPIRATORY: Clear to auscultation bilaterally. Effort and breath sounds normal, no problems with respiration noted. BREASTS: Symmetric in size. No masses, skin changes, nipple drainage, or lymphadenopathy. Implants in place, nipples pierced bilaterally ABDOMEN: Soft, normal bowel sounds, no distention noted.  No tenderness, rebound or guarding.  PELVIC: Normal appearing external genitalia; normal appearing vaginal mucosa and cervix.  No abnormal discharge noted.  Pap smear obtained.  Normal uterine size, no other palpable masses, no uterine or adnexal tenderness. MUSCULOSKELETAL: Normal range of motion. No tenderness.  No cyanosis, clubbing, or edema.  2+ distal pulses.  Exam done with chaperone present.  Assessment and Plan:   1. Routine screening for STI (sexually transmitted infection) - Cytology - PAP( [place]) - Hepatitis B surface antigen - Hepatitis C Antibody - HIV antibody (with reflex) - RPR - Cervicovaginal ancillary only( [place])  2. Encounter for counseling regarding contraception On OCPs, desires to continue Reviewed risks of pregnancy with missing pills Reviewed risks of VTE/stroke with OCPs, slightly increased risk with smoking, she verbalizes understanding and desires to continue  3. Well woman exam No issues Advised tobacco cessation, pt reports she is down to smoking very little Smoking and tobacco cessation was discussed at today's visit for 5 minutes  Counseled  regarding risks/benefits of flu vaccine, patient declines vaccine.   4. Cervical cancer screening - Cytology - PAP( [place])   Will follow up results of pap smear/STI screen and manage accordingly. Encouraged improvement in diet and exercise.  Flu vaccine declined Gardisil n/a  Routine preventative health maintenance measures emphasized. Please refer to After Visit Summary for other counseling recommendations.   Total face-to-face time with patient: 30 minutes. Over 50% of encounter was spent on counseling and coordination of care.   12/31/2018, M.D. Attending Center for [facility] [employer])

## 2019-01-16 NOTE — Progress Notes (Signed)
Last pap- 01/24/17- negative

## 2019-01-17 LAB — CERVICOVAGINAL ANCILLARY ONLY
Chlamydia: NEGATIVE
Comment: NEGATIVE
Comment: NEGATIVE
Comment: NORMAL
Neisseria Gonorrhea: NEGATIVE
Trichomonas: NEGATIVE

## 2019-01-17 LAB — CYTOLOGY - PAP
Comment: NEGATIVE
Diagnosis: NEGATIVE
High risk HPV: NEGATIVE

## 2019-01-17 LAB — HEPATITIS C ANTIBODY
Hepatitis C Ab: NONREACTIVE
SIGNAL TO CUT-OFF: 0.01 (ref <1.00)

## 2019-01-17 LAB — RPR: RPR Ser Ql: NONREACTIVE

## 2019-01-17 LAB — HIV ANTIBODY (ROUTINE TESTING W REFLEX): HIV 1&2 Ab, 4th Generation: NONREACTIVE

## 2019-01-17 LAB — HEPATITIS B SURFACE ANTIGEN: Hepatitis B Surface Ag: NONREACTIVE

## 2020-02-03 ENCOUNTER — Telehealth: Payer: Self-pay

## 2020-02-03 DIAGNOSIS — Z789 Other specified health status: Secondary | ICD-10-CM

## 2020-02-03 MED ORDER — NORGESTIMATE-ETH ESTRADIOL 0.25-35 MG-MCG PO TABS: 1.0000 | ORAL_TABLET | Freq: Every day | ORAL | 0 refills | Status: DC

## 2020-02-03 NOTE — Telephone Encounter (Signed)
Pt called to schedule annual appt and get refill of OCP because she is out. Pt was told she can have one refill until she comes for her annual. Refill sent. Annual scheduled for 02/20/19

## 2020-02-20 ENCOUNTER — Encounter: Payer: Self-pay | Admitting: Obstetrics and Gynecology

## 2020-02-20 ENCOUNTER — Ambulatory Visit (INDEPENDENT_AMBULATORY_CARE_PROVIDER_SITE_OTHER): Payer: 59 | Admitting: Obstetrics and Gynecology

## 2020-02-20 ENCOUNTER — Other Ambulatory Visit (HOSPITAL_COMMUNITY)
Admission: RE | Admit: 2020-02-20 | Discharge: 2020-02-20 | Disposition: A | Discharge status: Home / Self Care | Payer: 59 | Source: Ambulatory Visit | Attending: Obstetrics and Gynecology | Admitting: Obstetrics and Gynecology

## 2020-02-20 ENCOUNTER — Other Ambulatory Visit: Payer: Self-pay

## 2020-02-20 VITALS — BP 114/76 | HR 79 | Resp 16 | Ht 59.0 in | Wt 150.0 lb

## 2020-02-20 DIAGNOSIS — Z113 Encounter for screening for infections with a predominantly sexual mode of transmission: Secondary | ICD-10-CM | POA: Diagnosis not present

## 2020-02-20 DIAGNOSIS — Z789 Other specified health status: Secondary | ICD-10-CM

## 2020-02-20 DIAGNOSIS — Z01419 Encounter for gynecological examination (general) (routine) without abnormal findings: Secondary | ICD-10-CM | POA: Diagnosis not present

## 2020-02-20 MED ORDER — NORGESTIMATE-ETH ESTRADIOL 0.25-35 MG-MCG PO TABS: 1.0000 | ORAL_TABLET | Freq: Every day | ORAL | 3 refills | Status: DC

## 2020-02-20 NOTE — Progress Notes (Signed)
GYNECOLOGY ANNUAL PREVENTATIVE CARE ENCOUNTER NOTE  Subjective:   Cheryl Kennedy is a 35 y.o. G0P0000 female here for a annual gynecologic exam. Current complaints: none, doing well, happy to continue OCPs. Quit smoking last year    Denies abnormal vaginal bleeding, discharge, pelvic pain, problems with intercourse or other gynecologic concerns. Accepts STI screen.   Gynecologic History [name] was 01/30/2020. Contraception: OCP (estrogen/progesterone) Last Pap: 2020. Results: normal Last mammogram: n/a  Obstetric History OB History  Gravida Para Term Preterm AB Living  0 0 0 0 0 0  SAB IAB Ectopic Multiple Live Births  0 0 0 0      History reviewed. No pertinent past medical history.  Past Surgical History:  Procedure Laterality Date  . breast augmentation    . COSMETIC SURGERY     breast enhancement  . REMOVAL OF DRUG DELIVERY IMPLANT Left 03/01/2017   Procedure: REMOVAL OF DRUG DELIVERY IMPLANT;  Surgeon: Catalina Antigua, MD;  Location: [facility];  Service: Gynecology;  Laterality: Left;    Current Outpatient Medications on File Prior to Visit  Medication Sig Dispense Refill  . norgestimate-ethinyl estradiol (SPRINTEC 28) 0.25-35 MG-MCG tablet Take 1 tablet by mouth daily. 30 tablet 0   No current facility-administered medications on file prior to visit.    No Known Allergies  Social History   Socioeconomic History  . Marital status: Single    Spouse name: Not on file  . Number of children: Not on file  . Years of education: Not on file  . Highest education level: Not on file  Occupational History  . Not on file  Tobacco Use  . Smoking status: Current Every Day Smoker    Packs/day: 0.25    Years: 7.00    Pack years: 1.75    Types: Cigarettes  . Smokeless tobacco: Never Used  Vaping Use  . Vaping Use: Former  Substance and Sexual Activity  . Alcohol use: No    Alcohol/week: 0.0 standard drinks    Comment:  wine socially  . Drug use: No  . Sexual activity: Yes    Partners: Male    Birth control/protection: Pill  Other Topics Concern  . Not on file  Social History Narrative  . Not on file   Social Determinants of Health   Financial Resource Strain: Not on file  Food Insecurity: Not on file  Transportation Needs: Not on file  Physical Activity: Not on file  Stress: Not on file  Social Connections: Not on file  Intimate Partner Violence: Not on file    Family History  Problem Relation Age of Onset  . Hypertension Mother   . Diabetes Maternal Grandfather   . Stroke Maternal Grandfather   . Cancer Maternal Grandfather   . Cancer Paternal Grandfather     The following portions of the patient's history were reviewed and updated as appropriate: allergies, current medications, past family history, past medical history, past social history, past surgical history and problem list.  Review of Systems Pertinent items are noted in HPI.   Objective:  BP 114/76   Pulse 79   Resp 16   Ht 4\' 11"  (1.499 m)   Wt 150 lb (68 kg)   LMP 01/30/2020   BMI 30.30 kg/m  CONSTITUTIONAL: Well-developed, well-nourished female in no acute distress.  HENT:  Normocephalic, atraumatic, External right and left ear normal. Oropharynx is clear and moist EYES: Conjunctivae and EOM are normal. Pupils are equal, round, and  reactive to light. No scleral icterus.  NECK: Normal range of motion, supple, no masses.  Normal thyroid.  SKIN: Skin is warm and dry. No rash noted. Not diaphoretic. No erythema. No pallor. NEUROLOGIC: Alert and oriented to person, place, and time. Normal reflexes, muscle tone coordination. No cranial nerve deficit noted. PSYCHIATRIC: Normal mood and affect. Normal behavior. Normal judgment and thought content. CARDIOVASCULAR: Normal heart rate noted RESPIRATORY:  Effort normal, no problems with respiration noted. BREASTS: Symmetric in size. Well healed scars from augmentation, bilateral  nipple piercings. No masses, skin changes, nipple drainage, or lymphadenopathy. ABDOMEN: Soft, no distention noted.  No tenderness, rebound or guarding.  PELVIC: Normal appearing external genitalia; normal appearing vaginal mucosa and cervix.  No abnormal discharge noted.  Pelvic cultures obtained. Normal uterine size, no other palpable masses, no uterine or adnexal tenderness. MUSCULOSKELETAL: Normal range of motion. No tenderness.  No cyanosis, clubbing, or edema.  2+ distal pulses.  Exam done with chaperone present.  Assessment and Plan:   1. Uses birth control Cont OCPs, refill sent - norgestimate-ethinyl estradiol (SPRINTEC 28) 0.25-35 MG-MCG tablet; Take 1 tablet by mouth daily.  Dispense: 84 tablet; Refill: 3  2. Routine screening for STI (sexually transmitted infection) - Cervicovaginal ancillary only( [place]) - Hepatitis B surface antigen - Hepatitis C antibody - HIV Antibody (routine testing w rflx) - RPR  3. Well woman exam Healthy female exam Congratulated patient on quitting smoking!   Will follow up results of STI screen and manage accordingly. Encouraged improvement in diet and exercise.  Accepts STI screen. COVID vaccine UTD Mammogram n/a Referral for colonoscopy n/a Flu vaccine declines   Routine preventative health maintenance measures emphasized. Please refer to After Visit Summary for other counseling recommendations.   Total face-to-face time with patient: 25 minutes. Over 50% of encounter was spent on counseling and coordination of care.   Baldemar Lenis, MD, [facility] Attending Center for [employer] [facility])

## 2020-02-21 LAB — CERVICOVAGINAL ANCILLARY ONLY
Chlamydia: NEGATIVE
Comment: NEGATIVE
Comment: NEGATIVE
Comment: NORMAL
Neisseria Gonorrhea: NEGATIVE
Trichomonas: NEGATIVE

## 2020-02-21 LAB — HIV ANTIBODY (ROUTINE TESTING W REFLEX): HIV 1&2 Ab, 4th Generation: NONREACTIVE

## 2020-02-21 LAB — HEPATITIS C ANTIBODY
Hepatitis C Ab: NONREACTIVE
SIGNAL TO CUT-OFF: 0.01 (ref <1.00)

## 2020-02-21 LAB — RPR: RPR Ser Ql: NONREACTIVE

## 2020-02-21 LAB — HEPATITIS B SURFACE ANTIGEN: Hepatitis B Surface Ag: NONREACTIVE

## 2021-01-24 ENCOUNTER — Other Ambulatory Visit: Payer: Self-pay | Admitting: Obstetrics and Gynecology

## 2021-01-24 DIAGNOSIS — Z789 Other specified health status: Secondary | ICD-10-CM

## 2021-03-05 ENCOUNTER — Ambulatory Visit (INDEPENDENT_AMBULATORY_CARE_PROVIDER_SITE_OTHER): Payer: 59 | Admitting: Obstetrics and Gynecology

## 2021-03-05 ENCOUNTER — Encounter: Payer: Self-pay | Admitting: Obstetrics and Gynecology

## 2021-03-05 ENCOUNTER — Other Ambulatory Visit: Payer: Self-pay

## 2021-03-05 ENCOUNTER — Other Ambulatory Visit (HOSPITAL_COMMUNITY)
Admission: RE | Admit: 2021-03-05 | Discharge: 2021-03-05 | Disposition: A | Discharge status: Home / Self Care | Payer: 59 | Source: Ambulatory Visit | Attending: Obstetrics and Gynecology | Admitting: Obstetrics and Gynecology

## 2021-03-05 VITALS — BP 117/78 | HR 74 | Ht 59.0 in | Wt 145.0 lb

## 2021-03-05 DIAGNOSIS — Z113 Encounter for screening for infections with a predominantly sexual mode of transmission: Secondary | ICD-10-CM

## 2021-03-05 DIAGNOSIS — N911 Secondary amenorrhea: Secondary | ICD-10-CM | POA: Diagnosis not present

## 2021-03-05 DIAGNOSIS — Z01419 Encounter for gynecological examination (general) (routine) without abnormal findings: Secondary | ICD-10-CM

## 2021-03-05 NOTE — Progress Notes (Signed)
Pt has not had period since Sept or Oct Has taken multiple UPTs-all have been negative  Most recent UPT was 1/16- negative

## 2021-03-05 NOTE — Progress Notes (Signed)
GYNECOLOGY ANNUAL PREVENTATIVE CARE ENCOUNTER NOTE  History:     Cheryl Kennedy is a 36 y.o. G0P0000 female here for a routine annual gynecologic exam.  Current complaints: absent menstrual cycle.   Denies abnormal vaginal bleeding, discharge, pelvic pain, problems with intercourse or other gynecologic concerns.   Secondary amenohrrea. Had regular periods up until Sept/Oct of 2022. Has not had a period since. No changes In weight or stress levels. Does not desire pregnancy.  Stopped Birth control pills around the same time.   Gynecologic History [name] was 11/25/2020 (approximate). Contraception: none Last Pap: 2020.  Result was normal with negative HPV   Obstetric History OB History  Gravida Para Term Preterm AB Living  0 0 0 0 0 0  SAB IAB Ectopic Multiple Live Births  0 0 0 0      History reviewed. No pertinent past medical history.  Past Surgical History:  Procedure Laterality Date   breast augmentation     COSMETIC SURGERY     breast enhancement   REMOVAL OF DRUG DELIVERY IMPLANT Left 03/01/2017   Procedure: REMOVAL OF DRUG DELIVERY IMPLANT;  Surgeon: Catalina Antigua, MD;  Location: [facility];  Service: Gynecology;  Laterality: Left;    Current Outpatient Medications on File Prior to Visit  Medication Sig Dispense Refill   ESTARYLLA 0.25-35 MG-MCG tablet TAKE 1 TABLET BY MOUTH DAILY 84 tablet 3   fluvoxaMINE (LUVOX) 100 MG tablet SMARTSIG:1 Tablet(s) By Mouth Morning-Night     No current facility-administered medications on file prior to visit.    No Known Allergies  Social History:  reports that she has been smoking cigarettes. She has a 1.75 pack-year smoking history. She has never used smokeless tobacco. She reports that she does not drink alcohol and does not use drugs.  Family History  Problem Relation Age of Onset   Hypertension Mother    Diabetes Maternal Grandfather    Stroke Maternal Grandfather    Cancer  Maternal Grandfather    Cancer Paternal Grandfather     The following portions of the patient's history were reviewed and updated as appropriate: allergies, current medications, past family history, past medical history, past social history, past surgical history and problem list.  Review of Systems Pertinent items noted in HPI and remainder of comprehensive ROS otherwise negative.  Physical Exam:  BP 117/78    Pulse 74    Ht 4\' 11"  (1.499 m)    Wt 145 lb (65.8 kg)    LMP 11/25/2020 (Approximate)    BMI 29.29 kg/m  CONSTITUTIONAL: Well-developed, well-nourished female in no acute distress.  HENT:  Normocephalic, atraumatic, External right and left ear normal.  EYES: Conjunctivae and EOM are normal. Pupils are equal, round, and reactive to light. No scleral icterus.  NECK: Normal range of motion, supple, no masses.  Normal thyroid.  SKIN: Skin is warm and dry. No rash noted. Not diaphoretic. No erythema. No pallor. MUSCULOSKELETAL: Normal range of motion. No tenderness.  No cyanosis, clubbing, or edema. NEUROLOGIC: Alert and oriented to person, place, and time. Normal reflexes, muscle tone coordination.  PSYCHIATRIC: Normal mood and affect. Normal behavior. Normal judgment and thought content. CARDIOVASCULAR: Normal heart rate noted, regular rhythm RESPIRATORY: Clear to auscultation bilaterally. Effort and breath sounds normal, no problems with respiration noted. BREASTS: Symmetric in size. No masses, tenderness, skin changes, nipple drainage, or lymphadenopathy bilaterally. Performed in the presence of a chaperone. ABDOMEN: Soft, no distention noted.  No tenderness, rebound or  guarding.  PELVIC: Normal appearing external genitalia and urethral meatus; normal appearing vaginal mucosa and cervix.  No abnormal vaginal discharge noted.  Pap smear obtained.  Normal uterine size, no other palpable masses, no uterine or adnexal tenderness.  Performed in the presence of a chaperone.   Assessment  and Plan:   1. Well woman exam  - Cytology - PAP( [place])  2. Routine screening for STI (sexually transmitted infection)  - Cytology - PAP( [place])  3. Secondary amenorrhea  - Prolactin - FSH - TSH - HgB A1c - B-HCG Quant  - follow up in 2-4 weeks to review labs.   Routine preventative health maintenance measures emphasized. Please refer to After Visit Summary for other counseling recommendations.   Luke Falero, Harolyn Rutherford, NP Faculty Practice Center for [employer], [facility]

## 2021-03-06 LAB — HEMOGLOBIN A1C
Hgb A1c MFr Bld: 5 % of total Hgb (ref <5.7)
Mean Plasma Glucose: 97 mg/dL
eAG (mmol/L): 5.4 mmol/L

## 2021-03-06 LAB — FOLLICLE STIMULATING HORMONE: FSH: 2.6 m[IU]/mL

## 2021-03-06 LAB — TSH: TSH: 0.54 mIU/L

## 2021-03-06 LAB — HCG, QUANTITATIVE, PREGNANCY: HCG, Total, QN: <3 m[IU]/mL

## 2021-03-06 LAB — PROLACTIN: Prolactin: 8.7 ng/mL

## 2021-03-08 LAB — CYTOLOGY - PAP
Adequacy: ABSENT
Chlamydia: NEGATIVE
Comment: NEGATIVE
Comment: NEGATIVE
Comment: NEGATIVE
Comment: NORMAL
Diagnosis: NEGATIVE
High risk HPV: NEGATIVE
Neisseria Gonorrhea: NEGATIVE
Trichomonas: NEGATIVE

## 2021-03-15 ENCOUNTER — Telehealth: Payer: Self-pay | Admitting: *Deleted

## 2021-03-15 NOTE — Telephone Encounter (Signed)
Returned call from 10:13 AM. Left patient an urgent message to call and reschedule appointment as stated on patient's voicemail message.

## 2021-03-30 ENCOUNTER — Ambulatory Visit: Payer: 59 | Admitting: Obstetrics and Gynecology

## 2022-02-09 ENCOUNTER — Telehealth: Payer: Self-pay

## 2022-02-09 NOTE — Telephone Encounter (Signed)
Attempted to call pt to schedule annual appt. Phone rang a few times and then said the number is invalid. That is the only number in pt's chart and pt does not have MyChart
